# Patient Record
Sex: Male | Born: 1990 | Race: Black or African American | Hispanic: No | Marital: Single | Smoking: Current every day smoker
Health system: Southern US, Community
[De-identification: ages and names within clinical notes are randomized; demographics above are authoritative.]

## PROBLEM LIST (undated history)

## (undated) DIAGNOSIS — J45909 Unspecified asthma, uncomplicated: Secondary | ICD-10-CM

---

## 2013-09-15 ENCOUNTER — Emergency Department (HOSPITAL_COMMUNITY)
Admission: EM | Admit: 2013-09-15 | Discharge: 2013-09-15 | Discharge status: Against Medical Advice | Payer: Self-pay | Attending: Emergency Medicine | Admitting: Emergency Medicine

## 2013-09-15 ENCOUNTER — Encounter (HOSPITAL_COMMUNITY): Payer: Self-pay | Admitting: Emergency Medicine

## 2013-09-15 DIAGNOSIS — F17200 Nicotine dependence, unspecified, uncomplicated: Secondary | ICD-10-CM | POA: Insufficient documentation

## 2013-09-15 DIAGNOSIS — J45909 Unspecified asthma, uncomplicated: Secondary | ICD-10-CM | POA: Insufficient documentation

## 2013-09-15 DIAGNOSIS — R109 Unspecified abdominal pain: Secondary | ICD-10-CM | POA: Insufficient documentation

## 2013-09-15 HISTORY — DX: Unspecified asthma, uncomplicated: J45.909

## 2013-09-15 LAB — URINE MICROSCOPIC-ADD ON

## 2013-09-15 LAB — URINALYSIS, ROUTINE W REFLEX MICROSCOPIC
Bilirubin Urine: NEGATIVE
GLUCOSE, UA: NEGATIVE mg/dL
Hgb urine dipstick: NEGATIVE
KETONES UR: NEGATIVE mg/dL
Nitrite: NEGATIVE
Protein, ur: NEGATIVE mg/dL
Specific Gravity, Urine: 1.022 (ref 1.005–1.030)
Urobilinogen, UA: 0.2 mg/dL (ref 0.0–1.0)
pH: 6 (ref 5.0–8.0)

## 2013-09-15 LAB — COMPREHENSIVE METABOLIC PANEL
ALT: 20 U/L (ref 0–53)
AST: 22 U/L (ref 0–37)
Albumin: 4.3 g/dL (ref 3.5–5.2)
Alkaline Phosphatase: 56 U/L (ref 39–117)
Anion gap: 16 — ABNORMAL HIGH (ref 5–15)
BUN: 13 mg/dL (ref 6–23)
CO2: 24 meq/L (ref 19–32)
Calcium: 9.3 mg/dL (ref 8.4–10.5)
Chloride: 102 mEq/L (ref 96–112)
Creatinine, Ser: 0.83 mg/dL (ref 0.50–1.35)
GFR calc Af Amer: >90 mL/min (ref >90)
GLUCOSE: 83 mg/dL (ref 70–99)
POTASSIUM: 4.3 meq/L (ref 3.7–5.3)
Sodium: 142 mEq/L (ref 137–147)
Total Bilirubin: 0.6 mg/dL (ref 0.3–1.2)
Total Protein: 8.2 g/dL (ref 6.0–8.3)

## 2013-09-15 LAB — CBC WITH DIFFERENTIAL/PLATELET
Basophils Absolute: 0 10*3/uL (ref 0.0–0.1)
Basophils Relative: 0 % (ref 0–1)
Eosinophils Absolute: 0.1 10*3/uL (ref 0.0–0.7)
Eosinophils Relative: 2 % (ref 0–5)
HEMATOCRIT: 47.8 % (ref 39.0–52.0)
Hemoglobin: 16 g/dL (ref 13.0–17.0)
LYMPHS ABS: 1.6 10*3/uL (ref 0.7–4.0)
LYMPHS PCT: 26 % (ref 12–46)
MCH: 32.5 pg (ref 26.0–34.0)
MCHC: 33.5 g/dL (ref 30.0–36.0)
MCV: 97.2 fL (ref 78.0–100.0)
Monocytes Absolute: 0.4 10*3/uL (ref 0.1–1.0)
Monocytes Relative: 7 % (ref 3–12)
Neutro Abs: 3.9 10*3/uL (ref 1.7–7.7)
Neutrophils Relative %: 65 % (ref 43–77)
PLATELETS: 244 10*3/uL (ref 150–400)
RBC: 4.92 MIL/uL (ref 4.22–5.81)
RDW: 12.2 % (ref 11.5–15.5)
WBC: 6 10*3/uL (ref 4.0–10.5)

## 2013-09-15 NOTE — ED Notes (Signed)
Patient states he is having R flank pain that started this morning around 0700.  Patient denies urinary symptoms.

## 2013-09-15 NOTE — ED Notes (Signed)
Pt told registration that they were leaving.  °

## 2014-03-17 ENCOUNTER — Emergency Department (HOSPITAL_COMMUNITY): Payer: Self-pay

## 2014-03-17 ENCOUNTER — Emergency Department (HOSPITAL_COMMUNITY)
Admission: EM | Admit: 2014-03-17 | Discharge: 2014-03-17 | Disposition: A | Discharge status: Home / Self Care | Payer: Self-pay | Attending: Emergency Medicine | Admitting: Emergency Medicine

## 2014-03-17 ENCOUNTER — Encounter (HOSPITAL_COMMUNITY): Payer: Self-pay | Admitting: Emergency Medicine

## 2014-03-17 DIAGNOSIS — J45909 Unspecified asthma, uncomplicated: Secondary | ICD-10-CM | POA: Insufficient documentation

## 2014-03-17 DIAGNOSIS — R112 Nausea with vomiting, unspecified: Secondary | ICD-10-CM | POA: Insufficient documentation

## 2014-03-17 DIAGNOSIS — R1110 Vomiting, unspecified: Secondary | ICD-10-CM

## 2014-03-17 DIAGNOSIS — Z720 Tobacco use: Secondary | ICD-10-CM | POA: Insufficient documentation

## 2014-03-17 DIAGNOSIS — R109 Unspecified abdominal pain: Secondary | ICD-10-CM | POA: Insufficient documentation

## 2014-03-17 LAB — CBC WITH DIFFERENTIAL/PLATELET
BASOS ABS: 0 10*3/uL (ref 0.0–0.1)
Basophils Relative: 0 % (ref 0–1)
EOS ABS: 0.2 10*3/uL (ref 0.0–0.7)
EOS PCT: 2 % (ref 0–5)
HEMATOCRIT: 46.3 % (ref 39.0–52.0)
HEMOGLOBIN: 15.7 g/dL (ref 13.0–17.0)
LYMPHS ABS: 2 10*3/uL (ref 0.7–4.0)
Lymphocytes Relative: 17 % (ref 12–46)
MCH: 32.6 pg (ref 26.0–34.0)
MCHC: 33.9 g/dL (ref 30.0–36.0)
MCV: 96.3 fL (ref 78.0–100.0)
Monocytes Absolute: 1.1 10*3/uL — ABNORMAL HIGH (ref 0.1–1.0)
Monocytes Relative: 9 % (ref 3–12)
Neutro Abs: 8.4 10*3/uL — ABNORMAL HIGH (ref 1.7–7.7)
Neutrophils Relative %: 72 % (ref 43–77)
Platelets: 265 10*3/uL (ref 150–400)
RBC: 4.81 MIL/uL (ref 4.22–5.81)
RDW: 12.3 % (ref 11.5–15.5)
WBC: 11.7 10*3/uL — ABNORMAL HIGH (ref 4.0–10.5)

## 2014-03-17 LAB — COMPREHENSIVE METABOLIC PANEL
ALBUMIN: 4.6 g/dL (ref 3.5–5.2)
ALT: 46 U/L (ref 0–53)
AST: 40 U/L — AB (ref 0–37)
Alkaline Phosphatase: 51 U/L (ref 39–117)
Anion gap: 11 (ref 5–15)
BUN: 21 mg/dL (ref 6–23)
CO2: 25 mmol/L (ref 19–32)
CREATININE: 0.96 mg/dL (ref 0.50–1.35)
Calcium: 9 mg/dL (ref 8.4–10.5)
Chloride: 104 mmol/L (ref 96–112)
GFR calc non Af Amer: >90 mL/min (ref >90)
Glucose, Bld: 100 mg/dL — ABNORMAL HIGH (ref 70–99)
POTASSIUM: 3.7 mmol/L (ref 3.5–5.1)
SODIUM: 140 mmol/L (ref 135–145)
TOTAL PROTEIN: 7.9 g/dL (ref 6.0–8.3)
Total Bilirubin: 0.9 mg/dL (ref 0.3–1.2)

## 2014-03-17 LAB — LIPASE, BLOOD: Lipase: 43 U/L (ref 11–59)

## 2014-03-17 MED ORDER — KETOROLAC TROMETHAMINE 30 MG/ML IJ SOLN
30.0000 mg | Freq: Once | INTRAMUSCULAR | Status: AC
  Administered 2014-03-17: 30 mg via INTRAVENOUS
  Filled 2014-03-17: qty 1

## 2014-03-17 MED ORDER — HYDROMORPHONE HCL 1 MG/ML IJ SOLN
1.0000 mg | Freq: Once | INTRAMUSCULAR | Status: AC
  Administered 2014-03-17: 1 mg via INTRAVENOUS
  Filled 2014-03-17: qty 1

## 2014-03-17 MED ORDER — PROMETHAZINE HCL 25 MG PO TABS: 25.0000 mg | ORAL_TABLET | Freq: Four times a day (QID) | ORAL | Status: AC | PRN

## 2014-03-17 MED ORDER — DICYCLOMINE HCL 20 MG PO TABS: 20.0000 mg | ORAL_TABLET | Freq: Three times a day (TID) | ORAL | Status: DC | PRN

## 2014-03-17 MED ORDER — SODIUM CHLORIDE 0.9 % IV BOLUS (SEPSIS)
1000.0000 mL | Freq: Once | INTRAVENOUS | Status: AC
  Administered 2014-03-17: 1000 mL via INTRAVENOUS

## 2014-03-17 MED ORDER — DICYCLOMINE HCL 10 MG PO CAPS
10.0000 mg | ORAL_CAPSULE | Freq: Once | ORAL | Status: AC
  Administered 2014-03-17: 10 mg via ORAL
  Filled 2014-03-17: qty 1

## 2014-03-17 MED ORDER — ONDANSETRON HCL 4 MG/2ML IJ SOLN
4.0000 mg | Freq: Once | INTRAMUSCULAR | Status: AC
  Administered 2014-03-17: 4 mg via INTRAVENOUS
  Filled 2014-03-17: qty 2

## 2014-03-17 NOTE — ED Notes (Signed)
Pt is c/o upper abd pain that started about 2 hours ago  Pt states he has been vomiting and is unable to hold anything down  Pt has active vomiting in triage  Pt is diaphoretic in triage as well

## 2014-03-17 NOTE — Discharge Instructions (Signed)

## 2014-03-17 NOTE — ED Provider Notes (Signed)
CSN: [id number]     Arrival date & time 03/17/14  0310 History   First MD Initiated Contact with Patient 03/17/14 [phone]     Chief Complaint  Patient presents with  . Abdominal Pain  . Emesis      HPI Patient presents to the emergency department for planning of nausea and vomiting which began over the past 12 hours.  No diarrhea to this point.  He feels like he may begin to have diarrhea in the next little bit.  He denies chest pain or shortness of breath.  Reports abdominal cramping.  No recent sick contacts.  He states he has crampy and sharp abdominal pain at this time.   Past Medical History  Diagnosis Date  . Asthma    History reviewed. No pertinent past surgical history. Family History  Problem Relation Age of Onset  . Hypertension Other    History  Substance Use Topics  . Smoking status: Current Every Day Smoker -- 0.50 packs/day    Types: Cigarettes  . Smokeless tobacco: Not on file  . Alcohol Use: Yes    Review of Systems  All other systems reviewed and are negative.     Allergies  Review of patient's allergies indicates no known allergies.  Home Medications   Prior to Admission medications   Medication Sig Start Date End Date Taking? Authorizing Provider  dicyclomine (BENTYL) 20 MG tablet Take 1 tablet (20 mg total) by mouth every 8 (eight) hours as needed for spasms (abdominal pain). 03/17/14   Lyanne CoKevin M Keiston Manley, MD  promethazine (PHENERGAN) 25 MG tablet Take 1 tablet (25 mg total) by mouth every 6 (six) hours as needed for nausea or vomiting. 03/17/14   Lyanne CoKevin M Kendell Sagraves, MD   BP 179/98 mmHg  Pulse 88  Resp 13  SpO2 98% Physical Exam  Constitutional: He is oriented to person, place, and time. He appears well-developed and well-nourished.  HENT:  Head: Normocephalic and atraumatic.  Eyes: EOM are normal.  Neck: Normal range of motion.  Cardiovascular: Normal rate, regular rhythm, normal heart sounds and intact distal pulses.   Pulmonary/Chest: Effort normal  and breath sounds normal. No respiratory distress.  Abdominal: Soft. He exhibits no distension. There is no tenderness.  Musculoskeletal: Normal range of motion.  Neurological: He is alert and oriented to person, place, and time.  Skin: Skin is warm and dry.  Psychiatric: He has a normal mood and affect. Judgment normal.  Nursing note and vitals reviewed.   ED Course  Procedures (including critical care time) Labs Review Labs Reviewed  CBC WITH DIFFERENTIAL/PLATELET - Abnormal; Notable for the following:    WBC 11.7 (*)    Neutro Abs 8.4 (*)    Monocytes Absolute 1.1 (*)    All other components within normal limits  COMPREHENSIVE METABOLIC PANEL - Abnormal; Notable for the following:    Glucose, Bld 100 (*)    AST 40 (*)    All other components within normal limits  LIPASE, BLOOD    Imaging Review Dg Abd 2 Views  03/17/2014   CLINICAL DATA:  Acute onset of mid abdominal pain, nausea and vomiting. Initial encounter.  EXAM: ABDOMEN - 2 VIEW  COMPARISON:  None.  FINDINGS: The visualized bowel gas pattern is unremarkable. Scattered air and stool filled loops of colon are seen; no abnormal dilatation of small bowel loops is seen to suggest small bowel obstruction. No free intra-abdominal air is identified, though evaluation for free air is limited on a single supine  view. A small amount of fluid and air is seen in the stomach.  The visualized osseous structures are within normal limits; the sacroiliac joints are unremarkable in appearance. The visualized lung bases are essentially clear.  IMPRESSION: Unremarkable bowel gas pattern; no free intra-abdominal air seen. Small amount of stool noted in the colon.   Electronically Signed   By: Roanna Raider M.D.   On: 03/17/2014 06:03     EKG Interpretation None      MDM   Final diagnoses:  Vomiting  Abdominal pain    6:59 AM Patient is feeling much better at this time.  Discharge home in good condition.  Suspect gastroenteritis.   Patient understands to return to the ER for new or worsening symptoms.    Lyanne Co, MD 03/17/14 0700

## 2014-05-10 ENCOUNTER — Emergency Department (HOSPITAL_COMMUNITY)
Admission: EM | Admit: 2014-05-10 | Discharge: 2014-05-10 | Disposition: A | Discharge status: Home / Self Care | Payer: Self-pay | Source: Home / Self Care | Attending: Emergency Medicine | Admitting: Emergency Medicine

## 2014-05-10 NOTE — ED Notes (Signed)
No answer x2 

## 2014-05-11 ENCOUNTER — Emergency Department (HOSPITAL_COMMUNITY): Payer: Self-pay

## 2014-05-11 ENCOUNTER — Encounter (HOSPITAL_COMMUNITY): Payer: Self-pay | Admitting: *Deleted

## 2014-05-11 ENCOUNTER — Emergency Department (HOSPITAL_COMMUNITY)
Admission: EM | Admit: 2014-05-11 | Discharge: 2014-05-11 | Disposition: A | Discharge status: Home / Self Care | Payer: Self-pay | Attending: Emergency Medicine | Admitting: Emergency Medicine

## 2014-05-11 DIAGNOSIS — Z720 Tobacco use: Secondary | ICD-10-CM | POA: Insufficient documentation

## 2014-05-11 DIAGNOSIS — Z23 Encounter for immunization: Secondary | ICD-10-CM | POA: Insufficient documentation

## 2014-05-11 DIAGNOSIS — S80811A Abrasion, right lower leg, initial encounter: Secondary | ICD-10-CM | POA: Insufficient documentation

## 2014-05-11 DIAGNOSIS — X58XXXA Exposure to other specified factors, initial encounter: Secondary | ICD-10-CM | POA: Insufficient documentation

## 2014-05-11 DIAGNOSIS — Y9289 Other specified places as the place of occurrence of the external cause: Secondary | ICD-10-CM | POA: Insufficient documentation

## 2014-05-11 DIAGNOSIS — Y998 Other external cause status: Secondary | ICD-10-CM | POA: Insufficient documentation

## 2014-05-11 DIAGNOSIS — J45909 Unspecified asthma, uncomplicated: Secondary | ICD-10-CM | POA: Insufficient documentation

## 2014-05-11 DIAGNOSIS — S8991XA Unspecified injury of right lower leg, initial encounter: Secondary | ICD-10-CM

## 2014-05-11 DIAGNOSIS — S93421A Sprain of deltoid ligament of right ankle, initial encounter: Secondary | ICD-10-CM

## 2014-05-11 DIAGNOSIS — Y9389 Activity, other specified: Secondary | ICD-10-CM | POA: Insufficient documentation

## 2014-05-11 DIAGNOSIS — S93401A Sprain of unspecified ligament of right ankle, initial encounter: Secondary | ICD-10-CM | POA: Insufficient documentation

## 2014-05-11 MED ORDER — TETANUS-DIPHTH-ACELL PERTUSSIS 5-2.5-18.5 LF-MCG/0.5 IM SUSP
0.5000 mL | Freq: Once | INTRAMUSCULAR | Status: AC
  Administered 2014-05-11: 0.5 mL via INTRAMUSCULAR
  Filled 2014-05-11: qty 0.5

## 2014-05-11 MED ORDER — IBUPROFEN 600 MG PO TABS: 600.0000 mg | ORAL_TABLET | Freq: Four times a day (QID) | ORAL | Status: DC | PRN

## 2014-05-11 NOTE — ED Provider Notes (Signed)
CSN: [id number]     Arrival date & time 05/11/14  0038 History   First MD Initiated Contact with Patient 05/11/14 0048     Chief Complaint  Patient presents with  . Ankle Injury     (Consider location/radiation/quality/duration/timing/severity/associated sxs/prior Treatment) HPI Comments: Pateint tripped this afternoon twisting his knee and ankle He has applied ice but not taken any medication   Patient is a 24 y.o. male presenting with lower extremity injury.  Ankle Injury This is a new problem. Associated symptoms include joint swelling. Pertinent negatives include no fever. Nothing aggravates the symptoms. He has tried nothing for the symptoms. The treatment provided no relief.    Past Medical History  Diagnosis Date  . Asthma    History reviewed. No pertinent past surgical history. Family History  Problem Relation Age of Onset  . Hypertension Other    History  Substance Use Topics  . Smoking status: Current Every Day Smoker -- 0.50 packs/day    Types: Cigarettes  . Smokeless tobacco: Not on file  . Alcohol Use: Yes    Review of Systems  Constitutional: Negative for fever.  Musculoskeletal: Positive for joint swelling and gait problem.  Skin: Positive for wound.  All other systems reviewed and are negative.     Allergies  Review of patient's allergies indicates no known allergies.  Home Medications   Prior to Admission medications   Medication Sig Start Date End Date Taking? Authorizing Provider  dicyclomine (BENTYL) 20 MG tablet Take 1 tablet (20 mg total) by mouth every 8 (eight) hours as needed for spasms (abdominal pain). Patient not taking: Reported on 05/11/2014 03/17/14   Azalia BilisKevin Campos, MD  ibuprofen (ADVIL,MOTRIN) 600 MG tablet Take 1 tablet (600 mg total) by mouth every 6 (six) hours as needed. 05/11/14   Earley FavorGail Marketa Midkiff, NP  promethazine (PHENERGAN) 25 MG tablet Take 1 tablet (25 mg total) by mouth every 6 (six) hours as needed for nausea or vomiting. Patient  not taking: Reported on 05/11/2014 03/17/14   Azalia BilisKevin Campos, MD   BP 133/85 mmHg  Pulse 86  Temp(Src) 98 F (36.7 C) (Oral)  Resp 18  SpO2 98% Physical Exam  Constitutional: He appears well-developed and well-nourished.  HENT:  Head: Normocephalic.  Eyes: Pupils are equal, round, and reactive to light.  Neck: Normal range of motion.  Cardiovascular: Normal rate.   Pulmonary/Chest: Effort normal.  Abdominal: Soft.  Musculoskeletal: Normal range of motion. He exhibits edema and tenderness.       Right knee: He exhibits normal range of motion and no swelling.       Right ankle: He exhibits swelling and deformity. He exhibits normal range of motion and no laceration.       Legs: Neurological: He is alert.  Skin: Skin is warm.  Nursing note and vitals reviewed.   ED Course  Procedures (including critical care time) Labs Review Labs Reviewed - No data to display  Imaging Review Dg Ankle Complete Right  05/11/2014   CLINICAL DATA:  Pain and swelling of the lateral ankle after twisting injury.  EXAM: RIGHT ANKLE - COMPLETE 3+ VIEW  COMPARISON:  None.  FINDINGS: There is no evidence of fracture, dislocation, or joint effusion. There is no evidence of arthropathy or other focal bone abnormality. Soft tissues are unremarkable.  IMPRESSION: Negative.   Electronically Signed   By: Burman NievesWilliam  Stevens M.D.   On: 05/11/2014 01:13   Dg Knee Complete 4 Views Right  05/11/2014   CLINICAL DATA:  Pain and swelling of the lateral knee after twisting injury.  EXAM: RIGHT KNEE - COMPLETE 4+ VIEW  COMPARISON:  None.  FINDINGS: There is no evidence of fracture, dislocation, or joint effusion. There is no evidence of arthropathy or other focal bone abnormality. Soft tissues are unremarkable.  IMPRESSION: Negative.   Electronically Signed   By: Burman Nieves M.D.   On: 05/11/2014 01:12     EKG Interpretation None     Patient has been placed in ASO and Knee sleeve for comfort  MDM   Final diagnoses:    Sprain, ankle joint, medial, right, initial encounter  Knee injury, right, initial encounter         Earley Favor, NP 05/11/14 0148  Marisa Severin, MD 05/11/14 0300

## 2014-05-11 NOTE — ED Notes (Signed)
Pt reports he was going outside when he tripped-twisting his R ankle.  Pt reports pain.

## 2014-11-01 ENCOUNTER — Encounter (HOSPITAL_COMMUNITY): Payer: Self-pay | Admitting: Emergency Medicine

## 2014-11-01 ENCOUNTER — Emergency Department (HOSPITAL_COMMUNITY)
Admission: EM | Admit: 2014-11-01 | Discharge: 2014-11-01 | Disposition: A | Discharge status: Home / Self Care | Payer: BLUE CROSS/BLUE SHIELD | Attending: Emergency Medicine | Admitting: Emergency Medicine

## 2014-11-01 DIAGNOSIS — J069 Acute upper respiratory infection, unspecified: Secondary | ICD-10-CM | POA: Insufficient documentation

## 2014-11-01 DIAGNOSIS — Z720 Tobacco use: Secondary | ICD-10-CM | POA: Insufficient documentation

## 2014-11-01 DIAGNOSIS — J45909 Unspecified asthma, uncomplicated: Secondary | ICD-10-CM | POA: Diagnosis not present

## 2014-11-01 DIAGNOSIS — R0981 Nasal congestion: Secondary | ICD-10-CM | POA: Diagnosis present

## 2014-11-01 NOTE — ED Notes (Signed)
Per pt, states cold symptoms since yesterday

## 2014-11-01 NOTE — Discharge Instructions (Signed)
Please take your prescriptions as prescribed. You may also take an over there counter decongestant (i.e. Sudafed). Please follow up with a primary care provider from the Resource Guide provided in 1 week. Please return to the Emergency Department if symptoms worsen or new onset of fever, headache, difficulty swallowing, neck swelling, difficulty breathing, chest pain.

## 2014-11-01 NOTE — ED Provider Notes (Signed)
CSN: [id number]     Arrival date & time 11/01/14  1641 History  By signing my name below, I, Alexander Martinez, attest that this documentation has been prepared under the direction and in the presence of Melburn Hake, [place]. Electronically Signed: Marica Martinez, ED Scribe. 11/01/2014. 6:23 PM.   Chief Complaint  Patient presents with  . URI   HPI PCP: No PCP Per Patient HPI Comments: Lyndal Reggio is a 24 y.o. male who presents to the Emergency Department complaining of nasal congestion with associated rhinorrhea and sore throat onset yesterday. Pt reports taking Mucinex at home without relief. Pt denies sick contacts, fever, headache, ear pain, difficulty swallowing, SOB, chest pain, n/v, abd pain, chronic health conditions, daily meds, sesonal allergies, or any other Sx at this time.   Past Medical History  Diagnosis Date  . Asthma    History reviewed. No pertinent past surgical history. Family History  Problem Relation Age of Onset  . Hypertension Other    Social History  Substance Use Topics  . Smoking status: Current Every Day Smoker -- 0.50 packs/day    Types: Cigarettes  . Smokeless tobacco: None  . Alcohol Use: Yes    Review of Systems  Constitutional: Negative for fever and chills.  HENT: Positive for congestion, rhinorrhea and sore throat. Negative for ear pain and trouble swallowing.   Respiratory: Negative for cough and shortness of breath.   Cardiovascular: Negative for chest pain.  Gastrointestinal: Negative for nausea, vomiting and abdominal pain.  Neurological: Negative for headaches.  All other systems reviewed and are negative.  Allergies  Review of patient's allergies indicates no known allergies.  Home Medications   Prior to Admission medications   Medication Sig Start Date End Date Taking? Authorizing Provider  dicyclomine (BENTYL) 20 MG tablet Take 1 tablet (20 mg total) by mouth every 8 (eight) hours as needed for spasms (abdominal pain). Patient not  taking: Reported on 05/11/2014 03/17/14   Azalia Bilis, MD  ibuprofen (ADVIL,MOTRIN) 600 MG tablet Take 1 tablet (600 mg total) by mouth every 6 (six) hours as needed. 05/11/14   Earley Favor, NP  promethazine (PHENERGAN) 25 MG tablet Take 1 tablet (25 mg total) by mouth every 6 (six) hours as needed for nausea or vomiting. Patient not taking: Reported on 05/11/2014 03/17/14   Azalia Bilis, MD   Triage Vitals: BP 137/79 mmHg  Pulse 107  Temp(Src) 98.3 F (36.8 C) (Oral)  Resp 18  SpO2 100% Physical Exam  Constitutional: He is oriented to person, place, and time. He appears well-developed and well-nourished.  HENT:  Head: Normocephalic.  Right Ear: Tympanic membrane, external ear and ear canal normal.  Left Ear: Tympanic membrane, external ear and ear canal normal.  Nose: Rhinorrhea present. Right sinus exhibits no maxillary sinus tenderness and no frontal sinus tenderness. Left sinus exhibits no maxillary sinus tenderness and no frontal sinus tenderness.  Mouth/Throat: Uvula is midline, oropharynx is clear and moist and mucous membranes are normal.  Eyes: Conjunctivae and EOM are normal. Pupils are equal, round, and reactive to light.  Neck: Normal range of motion. Neck supple.  Cardiovascular: Normal rate, regular rhythm and normal heart sounds.  Exam reveals no gallop and no friction rub.   No murmur heard. Pulmonary/Chest: Effort normal and breath sounds normal. No respiratory distress. He has no wheezes. He has no rales. He exhibits no tenderness.  Abdominal: Soft. He exhibits no distension. There is no tenderness.  Musculoskeletal: Normal range of motion.  Lymphadenopathy:  He has no cervical adenopathy.  Neurological: He is alert and oriented to person, place, and time.  Skin: Skin is warm and dry.  Psychiatric: He has a normal mood and affect.  Nursing note and vitals reviewed.  ED Course  Procedures (including critical care time) DIAGNOSTIC STUDIES: Oxygen Saturation is 100% on  RA, nl by my interpretation.    COORDINATION OF CARE: 6:20 PM: Discussed treatment plan which includes meds (nasal spray, sudafed), PCP referral with pt at bedside; patient verbalizes understanding and agrees with treatment plan. Pt advised to f/u with PCP or return to ED if Sx worsen.     MDM   Final diagnoses:  URI (upper respiratory infection)    Pt presents with congestion, rhinorrhea and sore throat. VSS. Oral exam unremarkable. No trismus, drooling, neck swelling or stridor on exam. Lungs CTAB. I suspect sxs are likely due to viral URI. Plan to d/c pt home, pt given rx for decongestant. Advised pt to follow up with PCP (resource guide provided) in 4-5 days.   Evaluation does not show pathology requring ongoing emergent intervention or admission. Pt is hemodynamically stable and mentating appropriately. Discussed findings/results and plan with patient/guardian, who agrees with plan. All questions answered. Return precautions discussed and outpatient follow up given.   I personally performed the services described in this documentation, which was scribed in my presence. The recorded information has been reviewed and is accurate.    Satira Sark [place], [place] 11/01/14 2306  Bethann Berkshire, MD 11/01/14 [phone]

## 2015-02-01 ENCOUNTER — Emergency Department (HOSPITAL_COMMUNITY)
Admission: EM | Admit: 2015-02-01 | Discharge: 2015-02-01 | Disposition: A | Discharge status: Home / Self Care | Payer: BLUE CROSS/BLUE SHIELD | Attending: Emergency Medicine | Admitting: Emergency Medicine

## 2015-02-01 ENCOUNTER — Encounter (HOSPITAL_COMMUNITY): Payer: Self-pay | Admitting: *Deleted

## 2015-02-01 DIAGNOSIS — Z202 Contact with and (suspected) exposure to infections with a predominantly sexual mode of transmission: Secondary | ICD-10-CM | POA: Diagnosis not present

## 2015-02-01 DIAGNOSIS — F17210 Nicotine dependence, cigarettes, uncomplicated: Secondary | ICD-10-CM | POA: Insufficient documentation

## 2015-02-01 DIAGNOSIS — J45909 Unspecified asthma, uncomplicated: Secondary | ICD-10-CM | POA: Diagnosis not present

## 2015-02-01 DIAGNOSIS — R369 Urethral discharge, unspecified: Secondary | ICD-10-CM | POA: Diagnosis present

## 2015-02-01 MED ORDER — AZITHROMYCIN 250 MG PO TABS
1000.0000 mg | ORAL_TABLET | Freq: Once | ORAL | Status: AC
Start: 2015-02-01 — End: 2015-02-01
  Administered 2015-02-01: 1000 mg via ORAL
  Filled 2015-02-01: qty 4

## 2015-02-01 MED ORDER — CEFTRIAXONE SODIUM 250 MG IJ SOLR
250.0000 mg | Freq: Once | INTRAMUSCULAR | Status: AC
  Administered 2015-02-01: 250 mg via INTRAMUSCULAR
  Filled 2015-02-01: qty 250

## 2015-02-01 NOTE — ED Provider Notes (Signed)
CSN: [id number]     Arrival date & time 02/01/15  1131 History   First MD Initiated Contact with Patient 02/01/15 1314     Chief Complaint  Patient presents with  . Exposure to STD  . Penile Discharge   (Consider location/radiation/quality/duration/timing/severity/associated sxs/prior Treatment) HPI 24 y.o. male with no significant pmh, presents to the Emergency Department today complaining of white discharge from his penis for several months. At the time, he did not think this was unusual and did not see a provider for the symptoms. States that his partner is here for similar complaints and was tested last week. The partner received a follow up call today and was tested positive for Chlamydia. No abdominal pain, no N/V/D, no fevers. Otherwise asymptomatic.   Past Medical History  Diagnosis Date  . Asthma    History reviewed. No pertinent past surgical history. Family History  Problem Relation Age of Onset  . Hypertension Other    Social History  Substance Use Topics  . Smoking status: Current Every Day Smoker -- 0.50 packs/day    Types: Cigarettes  . Smokeless tobacco: None  . Alcohol Use: Yes    Review of Systems  Constitutional: Negative for fever, chills, diaphoresis and fatigue.  HENT: Negative for congestion, sinus pressure, sore throat and tinnitus.   Eyes: Negative for visual disturbance.  Respiratory: Negative for cough and shortness of breath.   Cardiovascular: Negative for chest pain.  Gastrointestinal: Negative for nausea, vomiting, abdominal pain, diarrhea and constipation.  Endocrine: Negative for cold intolerance and heat intolerance.  Genitourinary: Positive for discharge. Negative for dysuria and hematuria.  Musculoskeletal: Negative for back pain.  Skin: Negative for color change.  Neurological: Negative for dizziness, syncope, weakness, numbness and headaches.   Allergies  Review of patient's allergies indicates no known allergies.  Home Medications    Prior to Admission medications   Medication Sig Start Date End Date Taking? Authorizing Provider  dicyclomine (BENTYL) 20 MG tablet Take 1 tablet (20 mg total) by mouth every 8 (eight) hours as needed for spasms (abdominal pain). Patient not taking: Reported on 05/11/2014 03/17/14   Azalia Bilis, MD  ibuprofen (ADVIL,MOTRIN) 600 MG tablet Take 1 tablet (600 mg total) by mouth every 6 (six) hours as needed. 05/11/14   Earley Favor, NP  promethazine (PHENERGAN) 25 MG tablet Take 1 tablet (25 mg total) by mouth every 6 (six) hours as needed for nausea or vomiting. Patient not taking: Reported on 05/11/2014 03/17/14   Azalia Bilis, MD   BP 140/96 mmHg  Pulse 98  Temp(Src) 98 F (36.7 C) (Oral)  Resp 16  SpO2 100%   Physical Exam  Constitutional: He is oriented to person, place, and time. Vital signs are normal. He appears well-developed and well-nourished.  HENT:  Head: Normocephalic.  Right Ear: Hearing normal.  Left Ear: Hearing normal.  Eyes: Conjunctivae and EOM are normal. Pupils are equal, round, and reactive to light.  Neck: Neck supple.  Cardiovascular: Normal rate and regular rhythm.   Pulmonary/Chest: Effort normal.  Abdominal: Soft.  Genitourinary: Testes normal. Circumcised. No penile erythema or penile tenderness. Discharge (white) found.  Chaperone was present during the exam  Musculoskeletal: Normal range of motion.  Neurological: He is alert and oriented to person, place, and time.  Skin: Skin is warm and dry.  Psychiatric: He has a normal mood and affect. His speech is normal and behavior is normal. Thought content normal.   ED Course  Procedures (including critical care time) Labs Review  Labs Reviewed  GC/CHLAMYDIA PROBE AMP ([place] NOT [facility]   Imaging Review No results found. I have personally reviewed and evaluated these images and lab results as part of my medical decision-making.   EKG Interpretation None     MDM  I obtained HPI from  historian.  ED Course: GC Swab Azithro + Rocephin  Assessment: 24y M with no sig pmh presents with white penile discharge. Partner has similar symptoms and tested positive for Chlamydia. Will treat with Rocephin/Azithro while in ED. Counseled on abstinence for x10 days.  Patient is in no acute distress. Vital Signs are stable. Patient is able to ambulate. Patient able to tolerate PO.   Disposition/Plan:  DC home Additional Verbal discharge instructions given and discussed with patient.  Pt Instructed to f/u with PCP in next week  for evaluation and treatment if symptoms persist.    Supervising Physician  Cathren LaineKevin Steinl, MD   Final diagnoses:  Penile discharge        Audry Piliyler Khalie Wince, PA-C 02/01/15 [id number]  Cathren LaineKevin Steinl, MD 02/03/15 [phone]

## 2015-02-01 NOTE — Discharge Instructions (Signed)
Please read and follow all provided instructions.  Your diagnoses today include:  1. Penile discharge     Tests performed today include:  Vital signs. See below for your results today.   Medications prescribed:   None   Home care instructions:  Follow any educational materials contained in this packet. Abstain from sex for at least 10 days. Please have your partner treated as well.   Follow-up instructions: Please follow-up with your primary care provider in the next 48 hours for further evaluation of symptoms and treatment   Return instructions:   Please return to the Emergency Department if you do not get better, if you get worse, or new symptoms OR  - Fever (temperature greater than 101.61F)  - Bleeding that does not stop with holding pressure to the area    -Severe pain (please note that you may be more sore the day after your accident)  - Chest Pain  - Difficulty breathing  - Severe nausea or vomiting  - Inability to tolerate food and liquids  - Passing out  - Skin becoming red around your wounds  - Change in mental status (confusion or lethargy)  - New numbness or weakness     Please return if you have any other emergent concerns.  Additional Information:  Your vital signs today were: BP 140/96 mmHg   Pulse 98   Temp(Src) 98 F (36.7 C) (Oral)   Resp 16   SpO2 100% If your blood pressure (BP) was elevated above 135/85 this visit, please have this repeated by your doctor within one month. ---------------

## 2015-02-01 NOTE — ED Notes (Signed)
Pt states his sexual partner was told she had chlamydia last week. Pt states he has had whitish-clear discharge from his penis for the past several months but did not get treated because he thought it was normal. Pt denies dysuria.

## 2015-02-03 LAB — GC/CHLAMYDIA PROBE AMP (~~LOC~~) NOT AT ARMC
Chlamydia: POSITIVE — AB
Neisseria Gonorrhea: POSITIVE — AB

## 2015-02-05 ENCOUNTER — Telehealth (HOSPITAL_COMMUNITY): Payer: Self-pay

## 2015-02-05 NOTE — Telephone Encounter (Signed)
Spoke with pt. Verified ID. Informed of labs. Treated per protocol. [employer] form faxed. Pt informed to abstain from sexual activity x 10 days and to notify partner for testing and treatment.  

## 2015-06-19 ENCOUNTER — Emergency Department (HOSPITAL_COMMUNITY): Payer: No Typology Code available for payment source

## 2015-06-19 ENCOUNTER — Emergency Department (HOSPITAL_COMMUNITY)
Admission: EM | Admit: 2015-06-19 | Discharge: 2015-06-19 | Disposition: A | Discharge status: Home / Self Care | Payer: No Typology Code available for payment source | Attending: Emergency Medicine | Admitting: Emergency Medicine

## 2015-06-19 ENCOUNTER — Encounter (HOSPITAL_COMMUNITY): Payer: Self-pay | Admitting: Emergency Medicine

## 2015-06-19 DIAGNOSIS — G44319 Acute post-traumatic headache, not intractable: Secondary | ICD-10-CM | POA: Insufficient documentation

## 2015-06-19 DIAGNOSIS — R51 Headache: Secondary | ICD-10-CM | POA: Diagnosis present

## 2015-06-19 DIAGNOSIS — F17210 Nicotine dependence, cigarettes, uncomplicated: Secondary | ICD-10-CM | POA: Insufficient documentation

## 2015-06-19 DIAGNOSIS — J45909 Unspecified asthma, uncomplicated: Secondary | ICD-10-CM | POA: Diagnosis not present

## 2015-06-19 DIAGNOSIS — Y939 Activity, unspecified: Secondary | ICD-10-CM | POA: Diagnosis not present

## 2015-06-19 DIAGNOSIS — Z79899 Other long term (current) drug therapy: Secondary | ICD-10-CM | POA: Diagnosis not present

## 2015-06-19 DIAGNOSIS — Y92410 Unspecified street and highway as the place of occurrence of the external cause: Secondary | ICD-10-CM | POA: Diagnosis not present

## 2015-06-19 DIAGNOSIS — Y999 Unspecified external cause status: Secondary | ICD-10-CM | POA: Insufficient documentation

## 2015-06-19 MED ORDER — IBUPROFEN 800 MG PO TABS
800.0000 mg | ORAL_TABLET | Freq: Once | ORAL | Status: AC
  Administered 2015-06-19: 800 mg via ORAL
  Filled 2015-06-19: qty 1

## 2015-06-19 MED ORDER — IBUPROFEN 800 MG PO TABS
800.0000 mg | ORAL_TABLET | Freq: Four times a day (QID) | ORAL | Status: AC | PRN
Start: 2015-06-19 — End: ?
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
## 2015-11-23 ENCOUNTER — Encounter (HOSPITAL_COMMUNITY): Payer: Self-pay

## 2015-11-23 ENCOUNTER — Emergency Department (HOSPITAL_COMMUNITY)
Admission: EM | Admit: 2015-11-23 | Discharge: 2015-11-23 | Disposition: A | Discharge status: Home / Self Care | Payer: BLUE CROSS/BLUE SHIELD | Attending: Emergency Medicine | Admitting: Emergency Medicine

## 2015-11-23 ENCOUNTER — Emergency Department (HOSPITAL_COMMUNITY): Payer: BLUE CROSS/BLUE SHIELD

## 2015-11-23 DIAGNOSIS — R6889 Other general symptoms and signs: Secondary | ICD-10-CM

## 2015-11-23 DIAGNOSIS — F17210 Nicotine dependence, cigarettes, uncomplicated: Secondary | ICD-10-CM | POA: Insufficient documentation

## 2015-11-23 DIAGNOSIS — J111 Influenza due to unidentified influenza virus with other respiratory manifestations: Secondary | ICD-10-CM | POA: Diagnosis not present

## 2015-11-23 DIAGNOSIS — J45909 Unspecified asthma, uncomplicated: Secondary | ICD-10-CM | POA: Insufficient documentation

## 2015-11-23 DIAGNOSIS — R69 Illness, unspecified: Secondary | ICD-10-CM

## 2015-11-23 LAB — BASIC METABOLIC PANEL
ANION GAP: 9 (ref 5–15)
BUN: 16 mg/dL (ref 6–20)
CALCIUM: 9.1 mg/dL (ref 8.9–10.3)
CO2: 25 mmol/L (ref 22–32)
Chloride: 103 mmol/L (ref 101–111)
Creatinine, Ser: 0.98 mg/dL (ref 0.61–1.24)
Glucose, Bld: 118 mg/dL — ABNORMAL HIGH (ref 65–99)
POTASSIUM: 3.7 mmol/L (ref 3.5–5.1)
Sodium: 137 mmol/L (ref 135–145)

## 2015-11-23 LAB — CBC WITH DIFFERENTIAL/PLATELET
Basophils Absolute: 0 10*3/uL (ref 0.0–0.1)
Basophils Relative: 0 %
Eosinophils Absolute: 0 10*3/uL (ref 0.0–0.7)
Eosinophils Relative: 0 %
HCT: 41 % (ref 39.0–52.0)
Hemoglobin: 13.7 g/dL (ref 13.0–17.0)
Lymphocytes Relative: 10 %
Lymphs Abs: 1.2 10*3/uL (ref 0.7–4.0)
MCH: 31.2 pg (ref 26.0–34.0)
MCHC: 33.4 g/dL (ref 30.0–36.0)
MCV: 93.4 fL (ref 78.0–100.0)
Monocytes Absolute: 0.6 10*3/uL (ref 0.1–1.0)
Monocytes Relative: 5 %
Neutro Abs: 10.3 10*3/uL — ABNORMAL HIGH (ref 1.7–7.7)
Neutrophils Relative %: 85 %
Platelets: 262 10*3/uL (ref 150–400)
RBC: 4.39 MIL/uL (ref 4.22–5.81)
RDW: 12.6 % (ref 11.5–15.5)
WBC: 12.1 10*3/uL — ABNORMAL HIGH (ref 4.0–10.5)

## 2015-11-23 LAB — RAPID STREP SCREEN (MED CTR MEBANE ONLY): Streptococcus, Group A Screen (Direct): NEGATIVE

## 2015-11-23 NOTE — ED Notes (Signed)
PT DISCHARGED. INSTRUCTIONS GIVEN. AAOX4. PT IN NO APPARENT DISTRESS OR PAIN. THE OPPORTUNITY TO ASK QUESTIONS WAS PROVIDED. 

## 2015-11-23 NOTE — Discharge Instructions (Signed)
Please return without fail for worsening symptoms, including fevers > 7 days, difficulty breathing, severe chest pain, intractable vomiting, or any other symptoms concerning to you.  Your blood work, Chest x-ray were reassuring  Drink plenty of fluids and get rest.

## 2015-11-23 NOTE — ED Triage Notes (Signed)
Pt c/o sinus congestion since yesterday. Pt also c/o coughing up dark red blood, sore throat and ear pain. Pt states "it feels like I have a fever" but pt has not checked his temperature. Pt has been taking NyQuil at home but has no relief of symptoms.

## 2015-11-23 NOTE — ED Provider Notes (Signed)
[facility]-EMERGENCY DEPT Provider Note   CSN: [id number] Arrival date & time: 11/23/15  7846     History   Chief Complaint No chief complaint on file.   HPI Alexander Martinez is a 25 y.o. male.  HPI  25 year old male who presents with 3 days of flu like symptoms. History of asthma. Wife works at [employer] facility with milder URI symptoms recently. Over past 3 days, with chills, subjective fevers, sore throat, cough, runny nose, congestion, and ear pain. States last night he blew his nose, and had bloody phlegm from the nose that came out. No hemoptysis or hematemesis. Vomited x 2 last night, no diarrhea or abdominal pain. Has not taken medications for home. No dysuria, frequency of urine.   Past Medical History:  Diagnosis Date  . Asthma     There are no active problems to display for this patient.   History reviewed. No pertinent surgical history.     Home Medications    Prior to Admission medications   Medication Sig Start Date End Date Taking? Authorizing Provider  ibuprofen (ADVIL,MOTRIN) 200 MG tablet Take 400 mg by mouth every 6 (six) hours as needed for moderate pain.   Yes Historical Provider, MD  Pseudoeph-Doxylamine-DM-APAP (NYQUIL PO) Take 2 capsules by mouth at bedtime as needed. For cold/flusymptoms   Yes Historical Provider, MD  dicyclomine (BENTYL) 20 MG tablet Take 1 tablet (20 mg total) by mouth every 8 (eight) hours as needed for spasms (abdominal pain). Patient not taking: Reported on 05/11/2014 03/17/14   Azalia Bilis, MD  ibuprofen (ADVIL,MOTRIN) 800 MG tablet Take 1 tablet (800 mg total) by mouth every 6 (six) hours as needed. Patient not taking: Reported on 11/23/2015 06/19/15   Emi Holes, PA-C  promethazine (PHENERGAN) 25 MG tablet Take 1 tablet (25 mg total) by mouth every 6 (six) hours as needed for nausea or vomiting. Patient not taking: Reported on 05/11/2014 03/17/14   Azalia Bilis, MD    Family History Family History  Problem Relation Age of Onset  .  Hypertension Other     Social History Social History  Substance Use Topics  . Smoking status: Current Every Day Smoker    Packs/day: 0.50    Types: Cigarettes  . Smokeless tobacco: Never Used  . Alcohol use Yes     Allergies   Review of patient's allergies indicates no known allergies.   Review of Systems Review of Systems 10/14 systems reviewed and are negative other than those stated in the HPI   Physical Exam Updated Vital Signs BP 155/76 (BP Location: Right Arm)   Pulse 69   Temp 98.7 F (37.1 C) (Oral)   Resp 16   Ht  (1.803 m)   Wt 250 lb (113.4 kg)   SpO2 100%   BMI 34.87 kg/m   Physical Exam Physical Exam  Nursing note and vitals reviewed. Constitutional: Well developed, well nourished, non-toxic, and in no acute distress Head: Normocephalic and atraumatic.  Mouth/Throat: Oropharynx is clear and moist.  Neck: Normal range of motion. Neck supple.  Cardiovascular: Normal rate and regular rhythm.   Pulmonary/Chest: Effort normal and breath sounds normal.  Abdominal: Soft. There is no tenderness. There is no rebound and no guarding.  Musculoskeletal: Normal range of motion.  Neurological: Alert, no facial droop, fluent speech, moves all extremities symmetrically Skin: Skin is warm and dry.  Psychiatric: Cooperative  ED Treatments / Results  Labs (all labs ordered are listed, but only abnormal results are displayed)  Labs Reviewed  CBC WITH DIFFERENTIAL/PLATELET - Abnormal; Notable for the following:       Result Value   WBC 12.1 (*)    Neutro Abs 10.3 (*)    All other components within normal limits  BASIC METABOLIC PANEL - Abnormal; Notable for the following:    Glucose, Bld 118 (*)    All other components within normal limits  RAPID STREP SCREEN (NOT AT [facility])  CULTURE, GROUP A STREP [facility])    EKG  EKG Interpretation None       Radiology Dg Chest 2 View  Result Date: 11/23/2015 CLINICAL DATA:  Congestion and cough. EXAM: CHEST  2  VIEW COMPARISON:  None. FINDINGS: The cardiomediastinal silhouette is unremarkable. There is no evidence of focal airspace disease, pulmonary edema, suspicious pulmonary nodule/mass, pleural effusion, or pneumothorax. No acute bony abnormalities are identified. IMPRESSION: No active cardiopulmonary disease. Electronically Signed   By: Harmon Pier M.D.   On: 11/23/2015 14:39    Procedures Procedures (including critical care time)  Medications Ordered in ED Medications - No data to display   Initial Impression / Assessment and Plan / ED Course  I have reviewed the triage vital signs and the nursing notes.  Pertinent labs & imaging results that were available during my care of the patient were reviewed by me and considered in my medical decision making (see chart for details).  Clinical Course    Well appearing and in no acute distress. Afebrile and hemodynamically stable. Exam overall non-focal. Presentation consistent with viral illness/influenza like illness. No evidence of pneumonia on visualization of CXR. Strep negative. Unremarkable blood work. Discussed supportive care instructions for home. Strict return and follow-up instructions reviewed. He expressed understanding of all discharge instructions and felt comfortable with the plan of care.   Final Clinical Impressions(s) / ED Diagnoses   Final diagnoses:  Flu-like symptoms  Influenza-like illness    New Prescriptions Discharge Medication List as of 11/23/2015  3:23 PM       Lavera Guise, MD 11/23/15 [phone]

## 2015-11-23 NOTE — ED Notes (Signed)
Patient in radiology, will obtain blood upon return

## 2015-11-26 LAB — CULTURE, GROUP A STREP (THRC)

## 2016-04-30 ENCOUNTER — Emergency Department (HOSPITAL_COMMUNITY)
Admission: EM | Admit: 2016-04-30 | Discharge: 2016-04-30 | Disposition: A | Discharge status: Home / Self Care | Payer: BLUE CROSS/BLUE SHIELD | Attending: Emergency Medicine | Admitting: Emergency Medicine

## 2016-04-30 ENCOUNTER — Encounter (HOSPITAL_COMMUNITY): Payer: Self-pay | Admitting: Emergency Medicine

## 2016-04-30 DIAGNOSIS — Z79899 Other long term (current) drug therapy: Secondary | ICD-10-CM | POA: Diagnosis not present

## 2016-04-30 DIAGNOSIS — R6889 Other general symptoms and signs: Secondary | ICD-10-CM

## 2016-04-30 DIAGNOSIS — F17210 Nicotine dependence, cigarettes, uncomplicated: Secondary | ICD-10-CM | POA: Diagnosis not present

## 2016-04-30 DIAGNOSIS — R000 Tachycardia, unspecified: Secondary | ICD-10-CM | POA: Diagnosis not present

## 2016-04-30 DIAGNOSIS — J45909 Unspecified asthma, uncomplicated: Secondary | ICD-10-CM | POA: Diagnosis not present

## 2016-04-30 DIAGNOSIS — R05 Cough: Secondary | ICD-10-CM | POA: Insufficient documentation

## 2016-04-30 LAB — RAPID STREP SCREEN (MED CTR MEBANE ONLY): STREPTOCOCCUS, GROUP A SCREEN (DIRECT): NEGATIVE

## 2016-04-30 MED ORDER — SODIUM CHLORIDE 0.9 % IV BOLUS (SEPSIS): 1000.0000 mL | Freq: Once | INTRAVENOUS | Status: DC

## 2016-04-30 MED ORDER — SODIUM CHLORIDE 0.9 % IV BOLUS (SEPSIS)
500.0000 mL | Freq: Once | INTRAVENOUS | Status: AC
  Administered 2016-04-30: 500 mL via INTRAVENOUS

## 2016-04-30 MED ORDER — OSELTAMIVIR PHOSPHATE 75 MG PO CAPS: 75.0000 mg | ORAL_CAPSULE | Freq: Two times a day (BID) | ORAL | 0 refills | Status: AC

## 2016-04-30 NOTE — Discharge Instructions (Signed)
As discussed, well hydrated by drinking clear fluids to keep your urine clear. Take your medicine twice a day for the next 5 days. Follow-up with her primary care provider. Return to the emergency department if symptoms worsen in the meantime.

## 2016-04-30 NOTE — ED Provider Notes (Signed)
[facility]-EMERGENCY DEPT Provider Note   CSN: [id number] Arrival date & time: 04/30/16  1018     History   Chief Complaint Chief Complaint  Patient presents with  . Flu Like Symptoms    HPI Alexander Corinodd Colledge is a 26 y.o. male presenting with 1 day of sudden onset body aches, sneezing, coughing, fatigue, sore throat and one episode of posttussive emesis this morning. He reports associated subjective fever and chills. He does report a history of ill contacts with his wife having similar symptoms and has 2 young children at home. He hasn't tried anything for the symptoms. Denies abdominal pain, diarrhea, nausea, vomiting, dysuria, hematuria or other symptoms.  HPI  Past Medical History:  Diagnosis Date  . Asthma     There are no active problems to display for this patient.   History reviewed. No pertinent surgical history.     Home Medications    Prior to Admission medications   Medication Sig Start Date End Date Taking? Authorizing Provider  dicyclomine (BENTYL) 20 MG tablet Take 1 tablet (20 mg total) by mouth every 8 (eight) hours as needed for spasms (abdominal pain). Patient not taking: Reported on 05/11/2014 03/17/14   Azalia BilisKevin Campos, MD  ibuprofen (ADVIL,MOTRIN) 800 MG tablet Take 1 tablet (800 mg total) by mouth every 6 (six) hours as needed. Patient not taking: Reported on 11/23/2015 06/19/15   Emi HolesAlexandra M Law, PA-C  oseltamivir (TAMIFLU) 75 MG capsule Take 1 capsule (75 mg total) by mouth every 12 (twelve) hours. 04/30/16 05/05/16  Georgiana ShoreJessica B Carlesha Seiple, PA-C  promethazine (PHENERGAN) 25 MG tablet Take 1 tablet (25 mg total) by mouth every 6 (six) hours as needed for nausea or vomiting. Patient not taking: Reported on 05/11/2014 03/17/14   Azalia BilisKevin Campos, MD  Pseudoeph-Doxylamine-DM-APAP (NYQUIL PO) Take 2 capsules by mouth at bedtime as needed. For cold/flusymptoms    Historical Provider, MD    Family History Family History  Problem Relation Age of Onset  . Hypertension Other      Social History Social History  Substance Use Topics  . Smoking status: Current Every Day Smoker    Packs/day: 0.50    Types: Cigarettes  . Smokeless tobacco: Never Used  . Alcohol use Yes     Allergies   Patient has no known allergies.   Review of Systems Review of Systems  Constitutional: Negative for chills and fever.  HENT: Positive for congestion and sore throat. Negative for drooling, ear pain, facial swelling and trouble swallowing.   Eyes: Negative for pain and visual disturbance.  Respiratory: Negative for cough, choking, chest tightness, shortness of breath, wheezing and stridor.   Cardiovascular: Negative for chest pain, palpitations and leg swelling.  Gastrointestinal: Negative for abdominal distention, abdominal pain, blood in stool, diarrhea, nausea and vomiting.  Genitourinary: Negative for difficulty urinating, dysuria, flank pain and hematuria.  Musculoskeletal: Positive for myalgias. Negative for arthralgias, back pain, gait problem, joint swelling, neck pain and neck stiffness.       Generalized myalgias all the way to his fingertips.  Skin: Negative for color change, pallor and rash.  Neurological: Negative for seizures and syncope.     Physical Exam Updated Vital Signs BP (!) 150/95 (BP Location: Left Arm)   Pulse (!) 103   Temp 99 F (37.2 C) (Oral)   Resp 16   Ht 5\' 11"  (1.803 m)   Wt 113.4 kg   SpO2 100%   BMI 34.87 kg/m   Physical Exam  Constitutional: He appears well-developed and  well-nourished. No distress.  Afebrile, nontoxic-appearing, lying comfortably in bed in no acute distress.  HENT:  Head: Normocephalic and atraumatic.  Right Ear: External ear normal.  Left Ear: External ear normal.  Mouth/Throat: No oropharyngeal exudate.  Oropharynx is erythematous  Eyes: Conjunctivae and EOM are normal. Right eye exhibits no discharge. Left eye exhibits no discharge.  Neck: Normal range of motion. Neck supple.  Left cervical adenopathy   Cardiovascular: Normal rate and regular rhythm.   No murmur heard. Pulmonary/Chest: Effort normal and breath sounds normal. No respiratory distress. He has no wheezes. He has no rales. He exhibits no tenderness.  Abdominal: Soft. He exhibits no distension and no mass. There is no tenderness. There is no rebound and no guarding.  Musculoskeletal: He exhibits no edema.  Lymphadenopathy:    He has cervical adenopathy.  Neurological: He is alert.  Skin: Skin is warm and dry. No rash noted. He is not diaphoretic. No erythema. No pallor.  Psychiatric: He has a normal mood and affect.  Nursing note and vitals reviewed.    ED Treatments / Results  Labs (all labs ordered are listed, but only abnormal results are displayed) Labs Reviewed  RAPID STREP SCREEN (NOT AT [facility])  CULTURE, GROUP A STREP [facility])    EKG  EKG Interpretation None       Radiology No results found.  Procedures Procedures (including critical care time)  Medications Ordered in ED Medications  sodium chloride 0.9 % bolus 500 mL (0 mLs Intravenous Stopped 04/30/16 1357)     Initial Impression / Assessment and Plan / ED Course  I have reviewed the triage vital signs and the nursing notes.  Pertinent labs & imaging results that were available during my care of the patient were reviewed by me and considered in my medical decision making (see chart for details).     Patient presents with one day of generalized body aches and flu-like symptoms with know ill contact with similar symptoms. He was tachycardic in 110s and given IV fluids. Exam otherwise unremarkable. Lungs without abnormal sounds. Afebrile and non-toxic without the use of antipyretics. Rapid strep negative  Symptoms improved after IV fluids. HR 88 palpated by me during reassessment.  Symptoms consistent with flu-like illness. Will treat symptomatically and discharge home with close PCP follow up.  Discussed strict return precautions and advised to  return to the emergency department if experiencing any new or worsening symptoms. Instructions were understood and patient agreed with discharge plan.  Final Clinical Impressions(s) / ED Diagnoses   Final diagnoses:  Flu-like symptoms  Tachycardia    New Prescriptions Discharge Medication List as of 04/30/2016  2:04 PM    START taking these medications   Details  oseltamivir (TAMIFLU) 75 MG capsule Take 1 capsule (75 mg total) by mouth every 12 (twelve) hours., Starting Wed 04/30/2016, Until Mon 05/05/2016, Print         Georgiana Shore, PA-C 04/30/16 1707    Alvira Monday, MD 05/03/16 [phone]

## 2016-04-30 NOTE — ED Triage Notes (Signed)
Patient is complaining of generalized body aches, productive cough, sore throat, fatigue, and decreased appetite x1 day. Reports 1 episode of emesis this morning.

## 2016-05-02 LAB — CULTURE, GROUP A STREP (THRC)

## 2016-06-23 ENCOUNTER — Encounter (HOSPITAL_COMMUNITY): Payer: Self-pay | Admitting: Emergency Medicine

## 2016-06-23 ENCOUNTER — Emergency Department (HOSPITAL_COMMUNITY)
Admission: EM | Admit: 2016-06-23 | Discharge: 2016-06-23 | Disposition: A | Discharge status: Home / Self Care | Payer: BLUE CROSS/BLUE SHIELD | Attending: Emergency Medicine | Admitting: Emergency Medicine

## 2016-06-23 DIAGNOSIS — Z79899 Other long term (current) drug therapy: Secondary | ICD-10-CM | POA: Insufficient documentation

## 2016-06-23 DIAGNOSIS — J45909 Unspecified asthma, uncomplicated: Secondary | ICD-10-CM | POA: Diagnosis not present

## 2016-06-23 DIAGNOSIS — F17210 Nicotine dependence, cigarettes, uncomplicated: Secondary | ICD-10-CM | POA: Insufficient documentation

## 2016-06-23 DIAGNOSIS — R197 Diarrhea, unspecified: Secondary | ICD-10-CM | POA: Insufficient documentation

## 2016-06-23 LAB — COMPREHENSIVE METABOLIC PANEL
ALT: 12 U/L — ABNORMAL LOW (ref 17–63)
AST: 33 U/L (ref 15–41)
Albumin: 3.4 g/dL — ABNORMAL LOW (ref 3.5–5.0)
Alkaline Phosphatase: 57 U/L (ref 38–126)
Anion gap: 7 (ref 5–15)
BUN: 14 mg/dL (ref 6–20)
CO2: 26 mmol/L (ref 22–32)
Calcium: 8.5 mg/dL — ABNORMAL LOW (ref 8.9–10.3)
Chloride: 105 mmol/L (ref 101–111)
Creatinine, Ser: 0.84 mg/dL (ref 0.61–1.24)
GFR calc Af Amer: >60 mL/min (ref >60)
GFR calc non Af Amer: >60 mL/min (ref >60)
Glucose, Bld: 94 mg/dL (ref 65–99)
Potassium: 4.8 mmol/L (ref 3.5–5.1)
Sodium: 138 mmol/L (ref 135–145)
Total Bilirubin: 1.6 mg/dL — ABNORMAL HIGH (ref 0.3–1.2)
Total Protein: 6.3 g/dL — ABNORMAL LOW (ref 6.5–8.1)

## 2016-06-23 LAB — CBC WITH DIFFERENTIAL/PLATELET
Basophils Absolute: 0 10*3/uL (ref 0.0–0.1)
Basophils Relative: 0 %
Eosinophils Absolute: 0.1 10*3/uL (ref 0.0–0.7)
Eosinophils Relative: 2 %
HCT: 39.7 % (ref 39.0–52.0)
Hemoglobin: 12.8 g/dL — ABNORMAL LOW (ref 13.0–17.0)
Lymphocytes Relative: 17 %
Lymphs Abs: 1.1 10*3/uL (ref 0.7–4.0)
MCH: 30.5 pg (ref 26.0–34.0)
MCHC: 32.2 g/dL (ref 30.0–36.0)
MCV: 94.5 fL (ref 78.0–100.0)
Monocytes Absolute: 0.4 10*3/uL (ref 0.1–1.0)
Monocytes Relative: 6 %
Neutro Abs: 4.8 10*3/uL (ref 1.7–7.7)
Neutrophils Relative %: 75 %
Platelets: 242 10*3/uL (ref 150–400)
RBC: 4.2 MIL/uL — ABNORMAL LOW (ref 4.22–5.81)
RDW: 12.2 % (ref 11.5–15.5)
WBC: 6.4 10*3/uL (ref 4.0–10.5)

## 2016-06-23 LAB — LIPASE, BLOOD: LIPASE: 82 U/L — AB (ref 11–51)

## 2016-06-23 MED ORDER — DICYCLOMINE HCL 20 MG PO TABS: 20.0000 mg | ORAL_TABLET | Freq: Two times a day (BID) | ORAL | 0 refills | Status: AC

## 2016-06-23 MED ORDER — DICYCLOMINE HCL 10 MG PO CAPS
10.0000 mg | ORAL_CAPSULE | Freq: Once | ORAL | Status: AC
  Administered 2016-06-23: 10 mg via ORAL
  Filled 2016-06-23: qty 1

## 2016-06-23 MED ORDER — ONDANSETRON HCL 4 MG PO TABS: 4.0000 mg | ORAL_TABLET | Freq: Three times a day (TID) | ORAL | 0 refills | Status: AC | PRN

## 2016-06-23 MED ORDER — SODIUM CHLORIDE 0.9 % IV BOLUS (SEPSIS)
1000.0000 mL | Freq: Once | INTRAVENOUS | Status: AC
  Administered 2016-06-23: 1000 mL via INTRAVENOUS

## 2016-06-23 NOTE — ED Notes (Signed)
Pt states he understands instructions Home stable with family with steady gait. 

## 2016-06-23 NOTE — ED Provider Notes (Signed)
[facility]-EMERGENCY DEPT Provider Note   CSN: [id number] Arrival date & time: 06/23/16  0745     History   Chief Complaint Chief Complaint  Patient presents with  . Diarrhea    HPI Alexander Martinez is a 26 y.o. male.  HPI  Patient presents with three-hour history of generalized/upper, crampy abdominal pain associated with 5 episodes of nonbloody, non-mucus diarrhea. Abdominal pain is intermittent and is rated at 5/10 at worst. He reports eating at [employer] last night and is unsure if this caused the symptoms. Denies any sick contacts. Reports he was feeling fine prior to the onset of diarrhea. Has not eaten anything this morning. Reports some nausea when symptoms began but no vomiting. Has not tried anything for symptoms. Denies prior abdominal surgery. No daily medication use. Denies fever, urinary complaints, chest pain, trouble breathing, lightheadedness, headaches, history of pancreatitis, recent alcohol use.  Past Medical History:  Diagnosis Date  . Asthma     There are no active problems to display for this patient.   History reviewed. No pertinent surgical history.     Home Medications    Prior to Admission medications   Medication Sig Start Date End Date Taking? Authorizing Provider  dicyclomine (BENTYL) 20 MG tablet Take 1 tablet (20 mg total) by mouth 2 (two) times daily. 06/23/16   Maksim Peregoy, PA-C  ibuprofen (ADVIL,MOTRIN) 800 MG tablet Take 1 tablet (800 mg total) by mouth every 6 (six) hours as needed. Patient not taking: Reported on 11/23/2015 06/19/15   Emi Holes, PA-C  ondansetron (ZOFRAN) 4 MG tablet Take 1 tablet (4 mg total) by mouth every 8 (eight) hours as needed for nausea or vomiting. 06/23/16   Allora Bains, PA-C  promethazine (PHENERGAN) 25 MG tablet Take 1 tablet (25 mg total) by mouth every 6 (six) hours as needed for nausea or vomiting. Patient not taking: Reported on 05/11/2014 03/17/14   Azalia Bilis, MD    Family History Family History    Problem Relation Age of Onset  . Hypertension Other     Social History Social History  Substance Use Topics  . Smoking status: Current Every Day Smoker    Packs/day: 0.50    Types: Cigarettes  . Smokeless tobacco: Never Used  . Alcohol use No     Allergies   Patient has no known allergies.   Review of Systems Review of Systems  Constitutional: Negative for appetite change, chills and fever.  HENT: Positive for sneezing. Negative for ear pain, rhinorrhea and sore throat.   Eyes: Negative for photophobia and visual disturbance.  Respiratory: Negative for cough, chest tightness, shortness of breath and wheezing.   Cardiovascular: Negative for chest pain and palpitations.  Gastrointestinal: Positive for abdominal pain, diarrhea and nausea. Negative for blood in stool, constipation and vomiting.  Genitourinary: Negative for dysuria, hematuria and urgency.  Musculoskeletal: Negative for myalgias.  Skin: Negative for rash.  Neurological: Negative for dizziness, weakness, light-headedness and headaches.     Physical Exam Updated Vital Signs BP (!) 147/76   Pulse 70   Temp 98 F (36.7 C) (Oral)   Resp 18   Ht 5\' 11"  (1.803 m)   Wt 220 lb (99.8 kg)   SpO2 100%   BMI 30.68 kg/m   Physical Exam  Constitutional: He appears well-developed and well-nourished. No distress.  HENT:  Head: Normocephalic and atraumatic.  Nose: Nose normal.  Eyes: Conjunctivae and EOM are normal. Right eye exhibits no discharge. Left eye exhibits no discharge. No  scleral icterus.  Neck: Normal range of motion. Neck supple.  Cardiovascular: Normal rate, regular rhythm, normal heart sounds and intact distal pulses.  Exam reveals no gallop and no friction rub.   No murmur heard. Pulmonary/Chest: Effort normal and breath sounds normal. No respiratory distress.  Abdominal: Soft. Bowel sounds are normal. He exhibits no distension. There is tenderness (Generalized RUQ, epigastric, LUQ, suprapubic).  There is no rebound and no guarding.  Musculoskeletal: Normal range of motion. He exhibits no edema.  Neurological: He is alert. He exhibits normal muscle tone. Coordination normal.  Skin: Skin is warm and dry. No rash noted.  Psychiatric: He has a normal mood and affect.  Nursing note and vitals reviewed.    ED Treatments / Results  Labs (all labs ordered are listed, but only abnormal results are displayed) Labs Reviewed  COMPREHENSIVE METABOLIC PANEL - Abnormal; Notable for the following:       Result Value   Calcium 8.5 (*)    Total Protein 6.3 (*)    Albumin 3.4 (*)    ALT 12 (*)    Total Bilirubin 1.6 (*)    All other components within normal limits  LIPASE, BLOOD - Abnormal; Notable for the following:    Lipase 82 (*)    All other components within normal limits  CBC WITH DIFFERENTIAL/PLATELET - Abnormal; Notable for the following:    RBC 4.20 (*)    Hemoglobin 12.8 (*)    All other components within normal limits    EKG  EKG Interpretation None       Radiology No results found.  Procedures Procedures (including critical care time)  Medications Ordered in ED Medications  sodium chloride 0.9 % bolus 1,000 mL (0 mLs Intravenous Stopped 06/23/16 0954)     Initial Impression / Assessment and Plan / ED Course  I have reviewed the triage vital signs and the nursing notes.  Pertinent labs & imaging results that were available during my care of the patient were reviewed by me and considered in my medical decision making (see chart for details).     Patient's history and symptoms concerning for viral gastroenteritis. No concern for any acute pathology including pancreatitis, appendicitis, cholecystitis, diverticulitis. No imaging warranted at this time due to no focal finding on abdominal exam with palpation. Patient is afebrile with no history of fever. Lab work today included CBC, CMP, lipase. CBC and CMP unremarkable today. Lipase was elevated at 82. No need for  imaging at this time due to thIs small elevation. Patient reports improvement in symptoms with fluids. Successful by mouth/fluid challenge. Will give Bentyl and Zofran to be taken for nausea and discomfort. Advised him to increase fluid intake and to maintain adequate hydration. Patient appears stable for discharge. Strict return precautions given.  Final Clinical Impressions(s) / ED Diagnoses   Final diagnoses:  Diarrhea in adult patient    New Prescriptions New Prescriptions   DICYCLOMINE (BENTYL) 20 MG TABLET    Take 1 tablet (20 mg total) by mouth 2 (two) times daily.   ONDANSETRON (ZOFRAN) 4 MG TABLET    Take 1 tablet (4 mg total) by mouth every 8 (eight) hours as needed for nausea or vomiting.     Dietrich PatesKhatri, Meganne Rita, PA-C 06/23/16 1032    Shaune PollackIsaacs, [place], MD 06/23/16 [phone]

## 2016-06-23 NOTE — ED Triage Notes (Addendum)
Pt c/o abd pain and diarrhea that started at 5am -- woke pt up from sleep--  Also c/o  "break out of boils/pimples on legs that is getting worse"

## 2016-06-23 NOTE — ED Notes (Signed)
Pt taking po fluids and tolerating well. 

## 2016-06-23 NOTE — Discharge Instructions (Signed)
Take Bentyl as directed. Take Zofran as needed for nausea. Increase fluid and food intake to maintain adequate hydration. Return to ED for worsening pain, increasing vomiting, blood in stool, blood in vomit, fever, lightheadedness.

## 2017-06-19 IMAGING — DX DG CHEST 2V
2 series · 2 of 2 positions shown · non-contrast
Comparison: None.

CLINICAL DATA: Congestion and cough.

EXAM:
CHEST  2 VIEW

[chest pa]
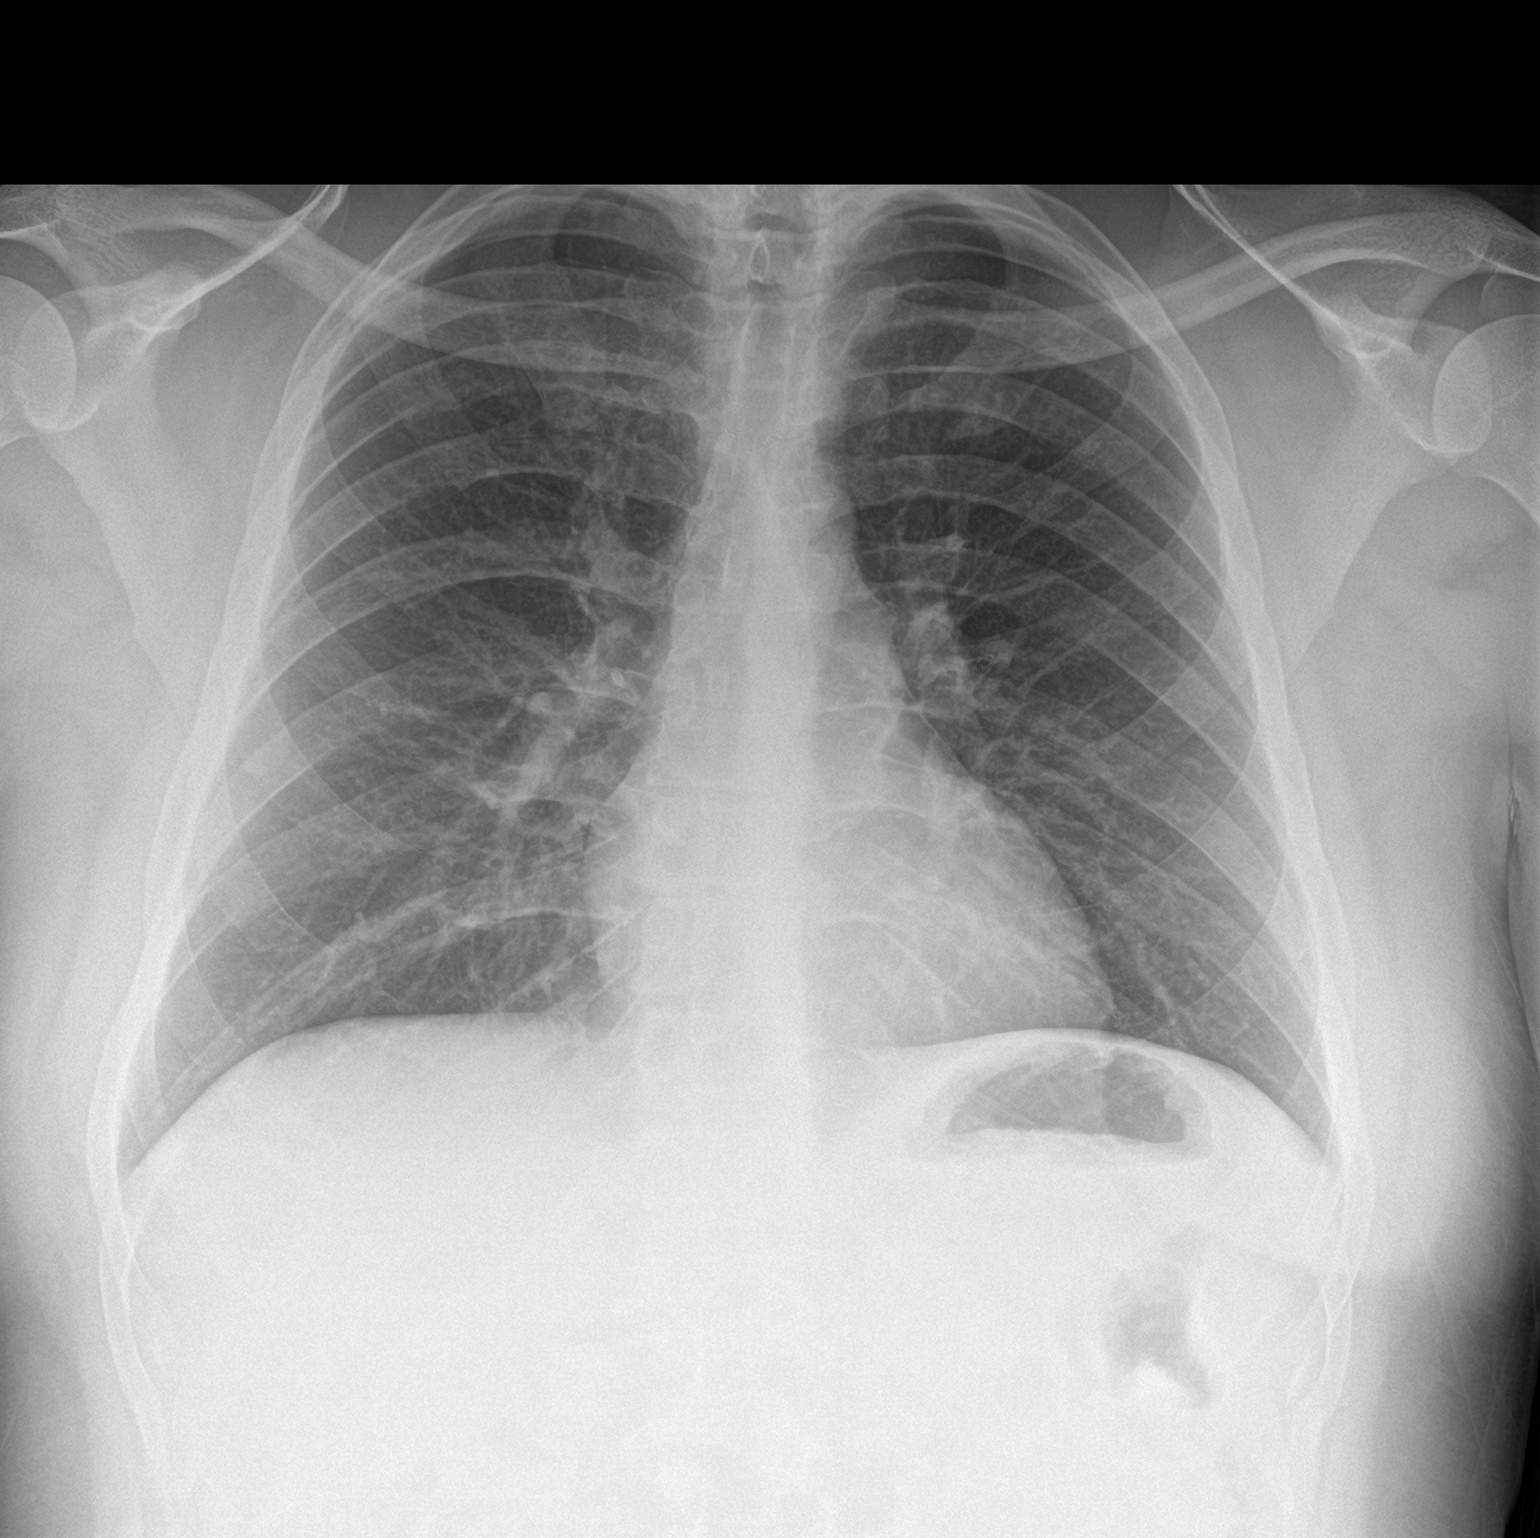

[chest lat]
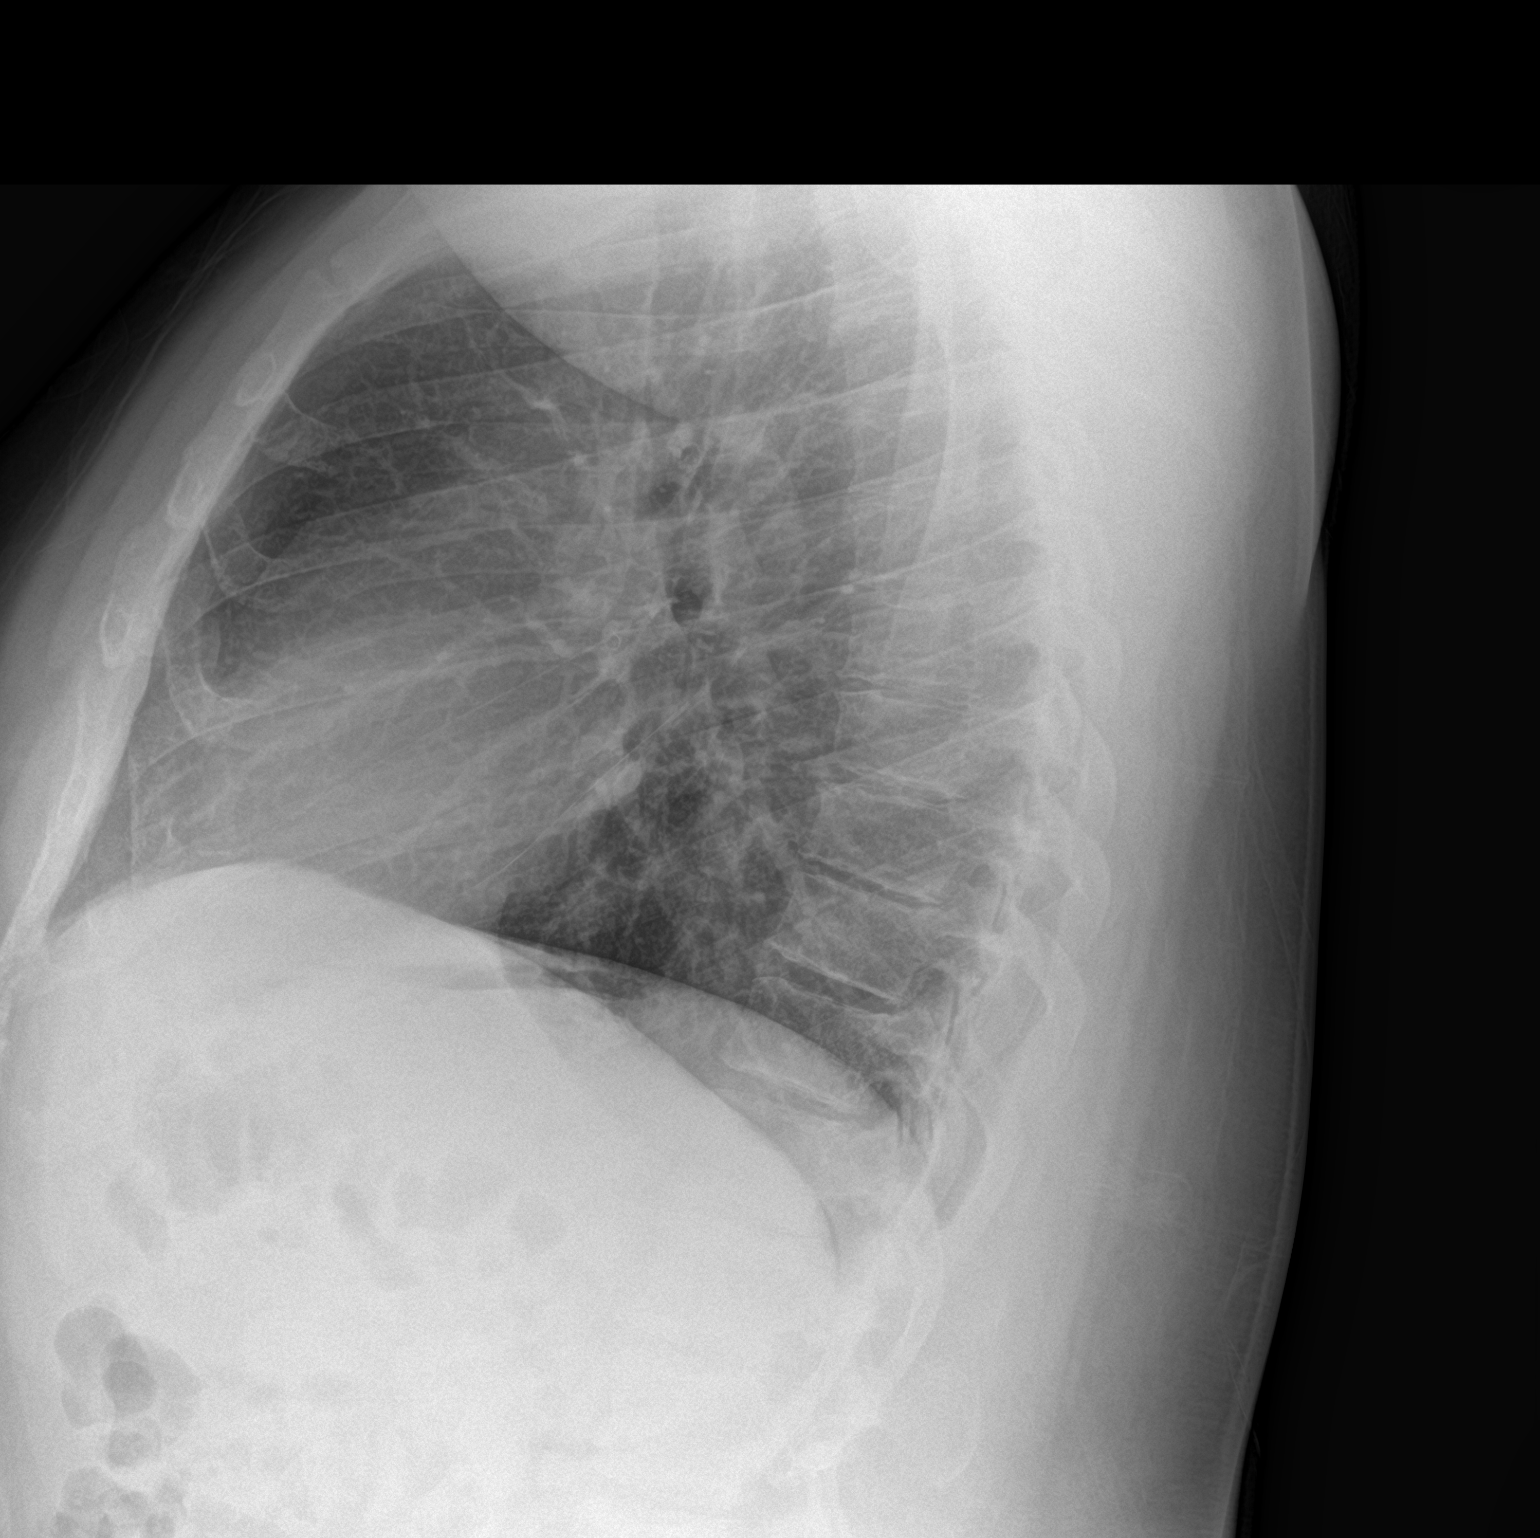

[2 of 2 positions shown; findings below may reference images not displayed]

FINDINGS: The cardiomediastinal silhouette is unremarkable.

There is no evidence of focal airspace disease, pulmonary edema,
suspicious pulmonary nodule/mass, pleural effusion, or pneumothorax.
No acute bony abnormalities are identified.
IMPRESSION: No active cardiopulmonary disease.
